# Patient Record
Sex: Female | Born: 1950 | Race: White | Hispanic: No | Marital: Single | Smoking: Never smoker
Health system: Southern US, Community
[De-identification: ages and names within clinical notes are randomized; demographics above are authoritative.]

## PROBLEM LIST (undated history)

## (undated) DIAGNOSIS — R21 Rash and other nonspecific skin eruption: Secondary | ICD-10-CM

## (undated) DIAGNOSIS — F419 Anxiety disorder, unspecified: Secondary | ICD-10-CM

## (undated) DIAGNOSIS — F900 Attention-deficit hyperactivity disorder, predominantly inattentive type: Secondary | ICD-10-CM

## (undated) DIAGNOSIS — R609 Edema, unspecified: Secondary | ICD-10-CM

## (undated) DIAGNOSIS — L409 Psoriasis, unspecified: Secondary | ICD-10-CM

## (undated) DIAGNOSIS — L309 Dermatitis, unspecified: Secondary | ICD-10-CM

## (undated) HISTORY — DX: Dermatitis, unspecified: L30.9

## (undated) HISTORY — DX: Anxiety disorder, unspecified: F41.9

## (undated) HISTORY — DX: Psoriasis, unspecified: L40.9

## (undated) HISTORY — PX: INCISION AND DRAINAGE ABSCESS / HEMATOMA OF BURSA / KNEE / THIGH: SUR668

## (undated) HISTORY — DX: Rash and other nonspecific skin eruption: R21

## (undated) HISTORY — DX: Edema, unspecified: R60.9

## (undated) HISTORY — DX: Attention-deficit hyperactivity disorder, predominantly inattentive type: F90.0

---

## 2010-11-23 ENCOUNTER — Emergency Department (HOSPITAL_COMMUNITY): Payer: Self-pay

## 2010-11-23 ENCOUNTER — Inpatient Hospital Stay (HOSPITAL_COMMUNITY)
Admission: EM | Admit: 2010-11-23 | Discharge: 2010-11-30 | DRG: 602 | Disposition: A | Discharge status: Home / Self Care | Payer: Self-pay | Source: Other Acute Inpatient Hospital | Attending: Surgery | Admitting: Surgery

## 2010-11-23 ENCOUNTER — Emergency Department (HOSPITAL_COMMUNITY)
Admission: EM | Admit: 2010-11-23 | Discharge: 2010-11-23 | Disposition: A | Discharge status: Other Acute Inpatient Hospital | Payer: Self-pay | Source: Home / Self Care | Attending: Emergency Medicine | Admitting: Emergency Medicine

## 2010-11-23 DIAGNOSIS — L03119 Cellulitis of unspecified part of limb: Secondary | ICD-10-CM | POA: Insufficient documentation

## 2010-11-23 DIAGNOSIS — L408 Other psoriasis: Secondary | ICD-10-CM | POA: Diagnosis present

## 2010-11-23 DIAGNOSIS — E876 Hypokalemia: Secondary | ICD-10-CM | POA: Insufficient documentation

## 2010-11-23 DIAGNOSIS — R197 Diarrhea, unspecified: Secondary | ICD-10-CM | POA: Diagnosis present

## 2010-11-23 DIAGNOSIS — A419 Sepsis, unspecified organism: Secondary | ICD-10-CM | POA: Insufficient documentation

## 2010-11-23 DIAGNOSIS — IMO0002 Reserved for concepts with insufficient information to code with codable children: Secondary | ICD-10-CM

## 2010-11-23 DIAGNOSIS — K7689 Other specified diseases of liver: Secondary | ICD-10-CM | POA: Insufficient documentation

## 2010-11-23 DIAGNOSIS — F1010 Alcohol abuse, uncomplicated: Secondary | ICD-10-CM | POA: Diagnosis present

## 2010-11-23 DIAGNOSIS — N179 Acute kidney failure, unspecified: Secondary | ICD-10-CM | POA: Diagnosis present

## 2010-11-23 DIAGNOSIS — N289 Disorder of kidney and ureter, unspecified: Secondary | ICD-10-CM | POA: Insufficient documentation

## 2010-11-23 DIAGNOSIS — K5730 Diverticulosis of large intestine without perforation or abscess without bleeding: Secondary | ICD-10-CM | POA: Insufficient documentation

## 2010-11-23 DIAGNOSIS — A409 Streptococcal sepsis, unspecified: Secondary | ICD-10-CM | POA: Diagnosis present

## 2010-11-23 DIAGNOSIS — R109 Unspecified abdominal pain: Secondary | ICD-10-CM | POA: Insufficient documentation

## 2010-11-23 DIAGNOSIS — L02419 Cutaneous abscess of limb, unspecified: Secondary | ICD-10-CM | POA: Insufficient documentation

## 2010-11-23 DIAGNOSIS — F411 Generalized anxiety disorder: Secondary | ICD-10-CM | POA: Diagnosis present

## 2010-11-23 LAB — URINE MICROSCOPIC-ADD ON

## 2010-11-23 LAB — BASIC METABOLIC PANEL
Chloride: 105 mEq/L (ref 96–112)
GFR calc Af Amer: 44 mL/min — ABNORMAL LOW (ref >60)
Potassium: 2.6 mEq/L — CL (ref 3.5–5.1)
Sodium: 134 mEq/L — ABNORMAL LOW (ref 135–145)

## 2010-11-23 LAB — URINALYSIS, ROUTINE W REFLEX MICROSCOPIC
Nitrite: NEGATIVE
Specific Gravity, Urine: 1.015 (ref 1.005–1.030)
Urobilinogen, UA: 0.2 mg/dL (ref 0.0–1.0)

## 2010-11-23 LAB — DIFFERENTIAL
Basophils Relative: 0 % (ref 0–1)
Eosinophils Absolute: 0 10*3/uL (ref 0.0–0.7)
Lymphs Abs: 0.5 10*3/uL — ABNORMAL LOW (ref 0.7–4.0)
Neutrophils Relative %: 94 % — ABNORMAL HIGH (ref 43–77)

## 2010-11-23 LAB — GLUCOSE, CAPILLARY: Glucose-Capillary: 133 mg/dL — ABNORMAL HIGH (ref 70–99)

## 2010-11-23 LAB — CBC
MCV: 91.9 fL (ref 78.0–100.0)
Platelets: 154 10*3/uL (ref 150–400)
RBC: 3.69 MIL/uL — ABNORMAL LOW (ref 3.87–5.11)
WBC: 18.4 10*3/uL — ABNORMAL HIGH (ref 4.0–10.5)

## 2010-11-23 LAB — LACTIC ACID, PLASMA: Lactic Acid, Venous: 2.4 mmol/L — ABNORMAL HIGH (ref 0.5–2.2)

## 2010-11-23 LAB — PROCALCITONIN: Procalcitonin: 8.25 ng/mL

## 2010-11-24 DIAGNOSIS — N171 Acute kidney failure with acute cortical necrosis: Secondary | ICD-10-CM

## 2010-11-24 DIAGNOSIS — L02419 Cutaneous abscess of limb, unspecified: Secondary | ICD-10-CM

## 2010-11-24 DIAGNOSIS — L03119 Cellulitis of unspecified part of limb: Secondary | ICD-10-CM

## 2010-11-24 DIAGNOSIS — A419 Sepsis, unspecified organism: Secondary | ICD-10-CM

## 2010-11-24 LAB — POCT I-STAT 3, ART BLOOD GAS (G3+)
O2 Saturation: 92 %
TCO2: 17 mmol/L (ref 0–100)
pCO2 arterial: 25.7 mmHg — ABNORMAL LOW (ref 35.0–45.0)
pH, Arterial: 7.416 — ABNORMAL HIGH (ref 7.350–7.400)
pO2, Arterial: 62 mmHg — ABNORMAL LOW (ref 80.0–100.0)

## 2010-11-24 LAB — APTT: aPTT: 26 seconds (ref 24–37)

## 2010-11-24 LAB — COMPREHENSIVE METABOLIC PANEL
Albumin: 2.4 g/dL — ABNORMAL LOW (ref 3.5–5.2)
Alkaline Phosphatase: 48 U/L (ref 39–117)
CO2: 21 mEq/L (ref 19–32)
Calcium: 7.2 mg/dL — ABNORMAL LOW (ref 8.4–10.5)
GFR calc Af Amer: 48 mL/min — ABNORMAL LOW (ref >60)
Sodium: 140 mEq/L (ref 135–145)
Total Bilirubin: 1.1 mg/dL (ref 0.3–1.2)
Total Protein: 5.4 g/dL — ABNORMAL LOW (ref 6.0–8.3)

## 2010-11-24 LAB — CBC
Hemoglobin: 10.8 g/dL — ABNORMAL LOW (ref 12.0–15.0)
MCH: 30.3 pg (ref 26.0–34.0)
RBC: 3.56 MIL/uL — ABNORMAL LOW (ref 3.87–5.11)
WBC: 15.1 10*3/uL — ABNORMAL HIGH (ref 4.0–10.5)

## 2010-11-24 LAB — PROTIME-INR: Prothrombin Time: 16.4 seconds — ABNORMAL HIGH (ref 11.6–15.2)

## 2010-11-24 LAB — MAGNESIUM: Magnesium: 1.7 mg/dL (ref 1.5–2.5)

## 2010-11-24 LAB — PHOSPHORUS: Phosphorus: 1.7 mg/dL — ABNORMAL LOW (ref 2.3–4.6)

## 2010-11-25 DIAGNOSIS — R6521 Severe sepsis with septic shock: Secondary | ICD-10-CM

## 2010-11-25 DIAGNOSIS — R6520 Severe sepsis without septic shock: Secondary | ICD-10-CM

## 2010-11-25 DIAGNOSIS — L03119 Cellulitis of unspecified part of limb: Secondary | ICD-10-CM

## 2010-11-25 DIAGNOSIS — A419 Sepsis, unspecified organism: Secondary | ICD-10-CM

## 2010-11-25 DIAGNOSIS — L02419 Cutaneous abscess of limb, unspecified: Secondary | ICD-10-CM

## 2010-11-25 LAB — BASIC METABOLIC PANEL
BUN: 10 mg/dL (ref 6–23)
GFR calc Af Amer: >60 mL/min (ref >60)
GFR calc non Af Amer: 52 mL/min — ABNORMAL LOW (ref >60)
Potassium: 2.7 mEq/L — CL (ref 3.5–5.1)
Sodium: 142 mEq/L (ref 135–145)

## 2010-11-25 LAB — CULTURE, BLOOD (ROUTINE X 2): Culture  Setup Time: 201203042124

## 2010-11-25 LAB — CBC
Platelets: 141 10*3/uL — ABNORMAL LOW (ref 150–400)
RDW: 13 % (ref 11.5–15.5)
WBC: 17.3 10*3/uL — ABNORMAL HIGH (ref 4.0–10.5)

## 2010-11-26 LAB — CBC
Hemoglobin: 9.2 g/dL — ABNORMAL LOW (ref 12.0–15.0)
MCV: 88.9 fL (ref 78.0–100.0)
Platelets: 159 10*3/uL (ref 150–400)
RBC: 2.98 MIL/uL — ABNORMAL LOW (ref 3.87–5.11)
WBC: 13.9 10*3/uL — ABNORMAL HIGH (ref 4.0–10.5)

## 2010-11-26 LAB — BASIC METABOLIC PANEL
BUN: 12 mg/dL (ref 6–23)
Chloride: 116 mEq/L — ABNORMAL HIGH (ref 96–112)
GFR calc Af Amer: >60 mL/min (ref >60)
Potassium: 2.9 mEq/L — ABNORMAL LOW (ref 3.5–5.1)

## 2010-11-26 LAB — MAGNESIUM: Magnesium: 1.8 mg/dL (ref 1.5–2.5)

## 2010-11-26 LAB — WOUND CULTURE

## 2010-11-26 LAB — CLOSTRIDIUM DIFFICILE BY PCR: Toxigenic C. Difficile by PCR: NEGATIVE

## 2010-11-27 LAB — BASIC METABOLIC PANEL
GFR calc non Af Amer: >60 mL/min (ref >60)
Potassium: 2.7 mEq/L — CL (ref 3.5–5.1)
Sodium: 141 mEq/L (ref 135–145)

## 2010-11-27 NOTE — Op Note (Signed)
  NAMEMIU, CHIONG             ACCOUNT NO.:  [id number]  MEDICAL RECORD NO.:  [id number]           PATIENT TYPE:  I  LOCATION:  2106                         FACILITY:  [facility]  PHYSICIAN:  Currie Paris, M.D.DATE OF BIRTH:  February 26, 1951  DATE OF PROCEDURE:  11/24/2010 DATE OF DISCHARGE:                              OPERATIVE REPORT   PREOPERATIVE DIAGNOSIS:  Abscess right thigh with surrounding cellulitis, likely strep.  POSTOPERATIVE DIAGNOSIS:  Abscess right thigh with surrounding cellulitis, likely strep.  PROCEDURE:  Incision and drainage of right thigh abscess.  SURGEON:  Currie Paris, MD  ANESTHESIA:  Local.  CLINICAL HISTORY:  This is a 59-year lady who was diagnosed with a septic syndrome, some renal insufficiency, hypokalemia, and a cellulitis of her right thigh.  She has been started on antibiotics.  Overnight, the area has somewhat extended with erythema and she has developed some "blistering" in the middle area of the erythema as what appears to be soft developing fluctuant area.  After discussion with the patient, she elected to have [place] drained this under local anesthesia.  Immediately after the discussion and identification of the site, the area was prepped with 1% Xylocaine with epi.  I then waited about 10 minutes, reprepped it with some Betadine.  I made an incision directly over the fluctuant area and pus drained out.  This was cultured.  There were some superficial pockets, somewhat slightly deeper, but I think this was fairly well drained with this I and D.  The patient had received some fentanyl just prior to doing the I and D, and was comfortable throughout.  Packing was placed and we will plan to see her again tomorrow.  I told her that if this does not begin to improve with this drainage, she may need to have this more widely drained in the operating room.     Currie Paris, M.D.     CJS/MEDQ  D:  11/24/2010  T:   11/25/2010  Job:  [id number]  Electronically Signed by Cyndia Bent M.D. on 11/26/2010 04:11:02 PM

## 2010-11-28 LAB — BASIC METABOLIC PANEL
CO2: 29 mEq/L (ref 19–32)
Chloride: 110 mEq/L (ref 96–112)
GFR calc non Af Amer: >60 mL/min (ref >60)
Glucose, Bld: 110 mg/dL — ABNORMAL HIGH (ref 70–99)
Potassium: 2.3 mEq/L — CL (ref 3.5–5.1)
Sodium: 143 mEq/L (ref 135–145)

## 2010-11-28 LAB — CBC
HCT: 28.8 % — ABNORMAL LOW (ref 36.0–46.0)
Hemoglobin: 10.1 g/dL — ABNORMAL LOW (ref 12.0–15.0)
WBC: 9.8 10*3/uL (ref 4.0–10.5)

## 2010-11-29 LAB — POTASSIUM: Potassium: 2.7 mEq/L — CL (ref 3.5–5.1)

## 2010-11-29 LAB — ANAEROBIC CULTURE

## 2010-11-29 LAB — MAGNESIUM: Magnesium: 1.9 mg/dL (ref 1.5–2.5)

## 2010-11-30 LAB — BASIC METABOLIC PANEL
BUN: 3 mg/dL — ABNORMAL LOW (ref 6–23)
GFR calc non Af Amer: >60 mL/min (ref >60)
Potassium: 3.5 mEq/L (ref 3.5–5.1)
Sodium: 142 mEq/L (ref 135–145)

## 2010-11-30 LAB — MAGNESIUM: Magnesium: 1.8 mg/dL (ref 1.5–2.5)

## 2010-11-30 NOTE — Consult Note (Signed)
[name], [name]  MEDICAL RECORD NO.:  Kennedy           PATIENT TYPE:  LOCATION:                                 FACILITY:  PHYSICIAN:  Lonia Blood, M.D.       DATE OF BIRTH:  [birth year]  DATE OF CONSULTATION:  11/29/2010 DATE OF DISCHARGE:                                CONSULTATION   [place] physician [place] in [place].  REQUESTING PHYSICIAN FOR CONSULTATION:  Emelia Loron, MD  REASON FOR CONSULTATION:  Persistent hypokalemia.  HISTORY OF PRESENT ILLNESS:  [name] is a 60 year old woman without major past medical history who was taking prednisone for psoriasis.  She developed a severe right thigh abscess with cellulitis on November 24, 2010 and was admitted to intensive care unit with sepsis and severe cellulitis.  She was started on broad-spectrum antibiotics and also on stress dose intravenous hydrocortisone.  For the course of hospitalization, [name] remains significantly hypokalemic.  She required initially fluid resuscitation and then following the fluid resuscitation, she had significant polyuria.  She then went on to develop severe diarrhea from the antibiotics and in fact today, November 29, 2010 is the first day where she has not been having constant diarrhea.  [name] also received stress dose of steroids.  The patient reports that she has a primary care physician at [facility] which has been checking a basic metabolic profile on her on a yearly basis and she was never told that she has hypokalemia before.  She does not have any congenital abnormalities that she knows of.  Her only past medical history is one of psoriasis, anxiety, and remote history of alcohol abuse.  She says she quit 3 years ago.  CURRENT MEDICATIONS: 1. Augmentin 875 mg twice a day. 2. Heparin 5000 units every 8 hours. 3. Protonix 40 mg daily. 4. Potassium runs. 5. Albuterol as needed. 6. Tylenol and  Zofran. 7. Percocet and Ambien.  SOCIAL HISTORY:  The patient lives alone in [place], [place]. She drinks a glass of wine once a month.  Denies smoking cigarettes or drug use.  FAMILY HISTORY:  Father is healthy.  REVIEW OF SYSTEMS:  Positive for the right thigh abscess and pain. Otherwise, the patient is in good health.  PHYSICAL EXAMINATION:  VITAL SIGNS:  Temperature 98.7, heart rate 68, respirations 18, blood pressure 116/65, saturating 94% on room air. GENERAL:  The patient is alert and oriented in no acute distress. HEAD:  Normocephalic, atraumatic. EYES:  Pupils are equal, round, react to light and accommodation. Extraocular movements are intact. THROAT:  Clear. NECK:  Supple.  No JVD. CHEST:  Clear to auscultation without wheezes, rhonchi, or crackles. HEART:  Regular rate and rhythm without murmurs, rubs, or gallops. ABDOMEN:  Soft, nontender.  Bowel sounds are present. EXTREMITIES:  Right lower extremity, there is an abscess in the inner right thigh that is packed with mild surrounding cellulitis.  No suspicious looking rashes.  LABORATORY VALUES AT THE TIME OF CONSULTATION:  Magnesium 1.1, potassium is 2.7.  Sodium is 143, chloride 110, bicarbonate 29, BUN 3,  creatinine 0.7, calcium 7.8.  Culture of the wound shows group A strep.  IMPRESSION/RECOMMENDATION:  This is a patient with severe persistent hypokalemia which I feel is related to diarrhea, polyuria, and steroid use.  I noted that the patient was admitted hypokalemic on November 20, 2010 with a potassium of 2.6.  The etiology of this hypokalemia is little bit obscured to me.  It is possible that this was related to prednisone usage in the outpatient setting.  I doubt that the patient has renal tubular abnormalities.  She does not report history of chronic hypokalemia.  In any case currently she has 3 good reasons to be hypokalemic from polyuria, diarrhea, and steroids.  I am going to replace the  patient's potassium adequately to 40 mEq every 4 hours x3 doses which should render normal potassium level by tomorrow, November 30, 2010.  If that is not the case, we will step up the investigation by checking a urine potassium level and urine creatinine level, calculating for the fraction __________ potassium.  I will discontinue the psyllium and the Protonix which prompt patient's to diarrhea.  Otherwise the rest of the care of the patient, antibiotics, and local care per the surgical service.     Lonia Blood, M.D.     SL/MEDQ  D:  11/29/2010  T:  11/29/2010  Job:  [id number]  cc:   Juanetta Gosling, MD  Electronically Signed by Lonia Blood M.D. on 11/30/2010 09:57:58 AM

## 2010-12-04 NOTE — H&P (Signed)
[name], [name] NO.:  [id number]  MEDICAL RECORD NO.:  [id number]           PATIENT TYPE:  I  LOCATION:  2106                         FACILITY:  [facility]  PHYSICIAN:  Orbie Hurst, MD         DATE OF BIRTH:  1951-05-27  DATE OF ADMISSION:  11/23/2010 DATE OF DISCHARGE:                             HISTORY & PHYSICAL   The patient was transferred from [facility].  ER ATTENDING:  Devoria Albe, MD  REASON FOR ADMISSION:  Right leg cellulitis.  HISTORY OF PRESENT ILLNESS:  The patient is a 60 year old female with past medical history of psoriasis who was transferred from [facility] due to the right leg cellulitis.  As per the patient and as per the medical records, the patient had right tight erythema that has started about 2 days ago and has been rapidly progressing.  She reported fevers of 103 yesterday and pain, warmth, and erythema in the area.  She was seen by her family members today on November 23, 2010, and they brought her to the ER at [facility].  On further review, the patient reports that she has been taking prednisone between 10-50 mg a day for the last 2 weeks for psoriasis exacerbation on her feet.  She denies any trauma or insect bite at the site of the cellulitis.  Initial CBC showed WBC 18.4 with neutrophils 94%. Creatinine was 1.48 and she has received about 5 liters of IV fluids with systolic blood pressure between 100 and 95 mmHg.  She was started on Zosyn, vancomycin, and clindamycin.  A CT scan of the right leg showed medial right thigh subcutaneous fat stranding with reactive inguinal adenopathy, findings are compatible with cellulitis.  The patient was transferred to [facility], and admitted to the MICU for further evaluation and management.  REVIEW OF SYSTEMS:  All other systems were negative except as above in HPI.  PAST MEDICAL HISTORY: 1. History of psoriasis on treatment with prednisone for the  last 2     weeks from 10-50 mg a with a tapered dose. 2. Possible history of alcohol abuse.  The patient drinks wine daily     from two glasses to one bottle a day.  PAST SURGICAL HISTORY:  No known surgeries.  ALLERGIES:  CODEINE with itching.  SOCIAL HISTORY:  The patient works as a Tax adviser.  She lives in [place] by herself.  She has one dog.  She reports that she drinks between two glasses of wine to one bottle every day.  She denies smoking or recent drug use.  She denies any significant occupational exposure or sick contacts.  FAMILY HISTORY:  Mother and father are healthy.  No history of lung disease, heart disease, or cancer in her family.  PHYSICAL EXAMINATION:  GENERAL:  The patient is alert and oriented x3, cooperative in no acute respiratory distress. VITAL SIGNS:  Blood pressure 95/72, respiratory rate 18, O2 saturation 97% on room air, respiratory rate 85, temperature 37.7.  HEENT:  Oral mucosa is moist.  No JVD.  No cervical or supraclavicular lymphadenopathy.  No oral  thrush and no postnasal drip.  CARDIOVASCULAR: Regular rhythm, S1 and S2 normal. LUNGS:  Clear breath sounds bilaterally with no wheezes, rhonchi, or crackles. ABDOMEN:  Soft, mildly tender in the right lower quadrant, left lower quadrant, and suprapubic area.  She also has tenderness on the right inguinal area.  Bowel sounds active and no peritoneal signs. EXTREMITIES:  The patient has erythema, warmth, and induration in the internal area of the right leg.  It has been marked and it has progressed from the internal area of the right leg to the posterior area.  Positive inguinal lymphadenopathy.  No calf tenderness. NEUROLOGICAL:  Alert and oriented x3.  No focal deficit.  LABORATORY AND IMAGING DATA: 1. CBC, WBC 18.4, hemoglobin 11.2, hematocrit 33.9, platelet 154,     neutrophils 94%. 2. BMP, sodium 134, potassium 2.6, chloride 105, CO2 of 19, glucose     142, BUN 28, creatinine 1.48, lactic  acid 2.4, procalcitonin 8.25,     MRSA screen negative. 3. Chest x-ray portable with no acute infiltrates or pleural     effusions. 4. CT scan of the abdomen and pelvis with mild gaseous distention and     air fluid level within the cecum.  Negative for evidence of bowel     obstruction or bowel wall thickening.  Evidence of diverticulosis     of the colon.  A 2-cm cystic structure in the right ovary. 5. CT scan of the right femur with the medial right side subcutaneous     fat stranding with reactive inguinal adenopathy.  ASSESSMENT AND PLAN:  The patient is a 60 year old female with past medical history of psoriasis and possible alcohol abuse who was admitted initially to [facility] due to right leg cellulitis which has been progressive for the last 2 days with increasing induration and erythema and warmth and fevers and who has been on prednisone treatment for the last 2 weeks due to psoriasis. 1. Cellulitis.  The patient has right leg cellulitis which has been     progressing for the last 2 days.  She has been taking     steroid treatment with prednisone for psoriasis for the last 2     weeks.  She denies any trauma or insect bite at the site.  She has     leukocytosis and a CT scan of the right leg showed findings     compatible with cellulitis.  We have to rule out necrotizing     fasciitis, due to the extension and rapid progression of these     lesions.  Surgery Service is consulted.  We will continue     antibiotics treatment with vancomycin, clindamycin, and Zosyn.  We     will get blood cultures and urine cultures.  MRSA screening test     was negative.  We continued hydrocortisone 100 mg twice a day due     to history of high-dose prednisone treatment to avoid adrenal insufficiency.     The patient will be admitted and followed at the intensive care     unit and will have low threshold to start sepsis protocol if her     blood pressure drops.  We will follow  complete metabolic panel     including liver function tests, CBC, cultures, and ABG. 2. History of psoriasis.  The patient received prednisone treatment     for the last 2 weeks which increases her risk of infections.  We     will continue hydrocortisone  at this point in time to avoid adrenal     insufficiency. 3. History of alcohol abuse.  As per the family members, the patient     has been drinking between two glasses to one bottle every day for     the last past year.  We will get CIWA protocol in place in case the     patient is agitated. 4. Deep vein thrombosis prophylaxis.  Heparin subcu. 5. Fluids, electrolytes, and nutrition.  We will continue normal     saline at 150 per hour.  Electrolytes to replace as     per ICU protocol.  We will repeat the complete metabolic panel to follow creatinine and     magnesium and phosphate levels.  N.p.o. for now.  TOTAL CRITICAL CARE TIME:  Sixty minutes.     Orbie Hurst, MD     JR/MEDQ  D:  11/24/2010  T:  11/24/2010  Job:  [id number]  Electronically Signed by Orbie Hurst M.D. on 12/04/2010 11:48:17 AM

## 2010-12-06 NOTE — Consult Note (Signed)
  NAMELARA, [name] NO.:  [id number]  MEDICAL RECORD NO.:  [id number]           PATIENT TYPE:  LOCATION:                                 FACILITY:  PHYSICIAN:  Cherylynn Ridges, M.D.    DATE OF BIRTH:  1951/05/08  DATE OF CONSULTATION: DATE OF DISCHARGE:                                CONSULTATION   REQUESTING PHYSICIAN:  Orbie Hurst, MD  Dear Dr. Synetta Fail:  Thank you very much for asking me to see [name], a pleasant 60- year-old female, with severe cellulitis of her right leg.  This started on Friday and worsened through the weekend.  She came to [facility] ER where she was found to have this significant cellulitis and/or infection; however, because of concerns of necrotizing fasciitis she was transferred to [facility] for further evaluation and treatment in the Medicine Service.  Her past medical history is unremarkable.  She has had no prior surgery.  I focused on examination where she had about a 15- x 13-cm right medial upper thigh infection, feels more like induration with some skin sloughing to a mild degree.  No fluctuance which correlates with the patient's CAT scan.  My impression is that she has a very severe deep cellulitis, does not appear to be a Fournier's as the patient does not appear to be that sick yet.  Her white count is 18,000. She is on IV antibiotics and the progression of the cellulitis seems to have improved, at least not worsened since she has been on her IV antibiotics.  We will go ahead and try to treat this medically; however, if it should worsen or if the patient gets sicker with septic complications then we will have to see about doing an open debridement of this area.     Cherylynn Ridges, M.D.     JOW/MEDQ  D:  11/24/2010  T:  11/24/2010  Job:  [id number]  Electronically Signed by Jimmye Norman M.D. on 12/06/2010 02:47:59 PM

## 2010-12-19 NOTE — Discharge Summary (Signed)
NAMEVAUNDA, [name]              ACCOUNT NO.:  [id number]  MEDICAL RECORD NO.:  [id number]           PATIENT TYPE:  I  LOCATION:  5532                         FACILITY:  [facility]  PHYSICIAN:  Juanetta Gosling, MDDATE OF BIRTH:  1951/03/07  DATE OF ADMISSION:  11/23/2010 DATE OF DISCHARGE:  11/30/2010                              DISCHARGE SUMMARY   DISCHARGING PHYSICIAN:  Juanetta Gosling, MD  PROCEDURES:  Incision and drainage of right thigh abscess at the bedside by Currie Paris, MD on November 24, 2010.  CONSULTANTS: 1. Cherylynn Ridges, MD with [facility]. 2. Lonia Blood, MD with Hospitalist.  REASON FOR ADMISSION:  [name] is a 60 year old female who was transferred from [facility] due to right leg cellulitis.  The patient began getting right thigh erythema which started approximately 2 days prior to admission.  It rapidly progressed.  She reports subjective fevers at home up to 103.  She stated she had increased pain, warmth and redness at this site on the inner portion of the right thigh.  She denied any trauma or insect bites to this area.  Upon arrival to the emergency department, she was found to have a white blood cell count of 18,400.  She was started on Zosyn, vancomycin, and clindamycin.  A CT scan of the right leg revealed right thigh subcutaneous fat stranding with reactive inguinal adenopathy which was consistent with cellulitis but no abscess.  Please see admitting history and physical for further details.  ADMITTING DIAGNOSES: 1. Right thigh cellulitis. 2. History of psoriasis. 3. History of alcohol abuse.  HOSPITAL COURSE:  At this time, the patient was admitted to NICU as upon evaluation, the patient was found to be somewhat hypotensive with a blood pressure of 95/72 and thought to be septic.  General Surgery was asked to evaluate the patient.  Upon initial evaluation, it was not felt that this area could develop into an  abscess and needed an incision and drainage.  However, by hospital day #2, the patient had developed worsening erythema along with blistering and vesicles noted on central portion of the cellulitic area.  Therefore, at this time, it was felt that the patient would need a bedside incision and drainage at the least to help fix this area.  Therefore, the area was anesthetized and incision and drainage at the bedside was completed.  Some purulent drainage was received and sent for culture.  This did grow back beta strep group A.  Beta strep group A was also found to be positive in one of her blood cultures.  B.i.d. wet-to-dry dressing changes were started. After the patient began to improve clinically, she was transferred to a regular floor.  Over the next several days, the redness began to decrease as well as pain.  Her dressing changes were continued.  She was shown how to do her dressing changes and she completed these herself and did not require any home health assistance for this.  The patient eventually was switched from all of her IV antibiotics to p.o. Augmentin.  During her hospitalization, the patient was found to have significant hypokalemia.  Each day, this was aggressively replaced.  However, it remained in the high 2 most of the time.  Her magnesium was checked which was normal.  However, due to low normal, she was replaced with 2 g of magnesium.  It did appear the patient began to auto-diurese given her recent sepsis.  Eventually after the patient was transferred to the SURGICAL SERVICE due to continued issues with hypokalemia, the hospitalists were consulted to rule out an adrenal insufficiency, cause for her hypokalemia.  They felt that her hypokalemia was secondary to polyuria, diarrhea, and recent steroid use.  As the patient's auto- diuresing began to decrease on the date of discharge, the patient's potassium has finally increased to 3.5.  At this time, the patient  was felt stable for discharge home.  DISCHARGE DIAGNOSES: 1. Right thigh abscess status post incision and drainage. 2. Group A Streptococcus bacteremia. 3. Hypokalemia. 4. Sepsis. 5. History of psoriasis. 6. History of alcohol abuse.  Please see medication reconciliation form for discharge medications.  DISCHARGE INSTRUCTIONS:  The patient has no activity restrictions.  They may walk up steps and may shower.  She has no dietary restrictions.  She is to do dressing changes twice daily with normal saline.  She is to call our office for an appointment in 2-3 weeks for followup.     Letha Cape, PA   ______________________________ Juanetta Gosling, MD    KEO/MEDQ  D:  12/17/2010  T:  12/18/2010  Job:  [id number]  Electronically Signed by Barnetta Chapel PA on 12/18/2010 10:17:35 AM Electronically Signed by Emelia Loron MD on 12/19/2010 08:24:11 AM

## 2011-06-10 ENCOUNTER — Encounter (INDEPENDENT_AMBULATORY_CARE_PROVIDER_SITE_OTHER): Payer: Self-pay | Admitting: Surgery

## 2011-06-11 ENCOUNTER — Ambulatory Visit (INDEPENDENT_AMBULATORY_CARE_PROVIDER_SITE_OTHER): Payer: BC Managed Care – PPO | Admitting: General Surgery

## 2011-06-11 ENCOUNTER — Encounter (INDEPENDENT_AMBULATORY_CARE_PROVIDER_SITE_OTHER): Payer: Self-pay | Admitting: General Surgery

## 2011-06-11 VITALS — BP 118/76 | HR 80 | Temp 96.8°F | Resp 16 | Ht 69.0 in | Wt 161.4 lb

## 2011-06-11 DIAGNOSIS — L02419 Cutaneous abscess of limb, unspecified: Secondary | ICD-10-CM

## 2011-06-11 DIAGNOSIS — IMO0002 Reserved for concepts with insufficient information to code with codable children: Secondary | ICD-10-CM

## 2011-06-11 DIAGNOSIS — L02411 Cutaneous abscess of right axilla: Secondary | ICD-10-CM

## 2011-06-11 NOTE — Patient Instructions (Signed)
Cover with gauze.  Remove iodoform gauze Saturday.  Continue antibiotics. Return next week

## 2011-06-11 NOTE — Progress Notes (Signed)
Subjective:     Patient ID: [name], female   DOB: 25-Jun-1951, 60 y.o.   MRN: [id number]  HPI Patient presents complaining of right axillary abscess. It drained a small amount of fluid. She is to keep unit last night. It has been painful. She was seen at urgent care and started on Septra DS. She is known to our acute care surgery service from previous abscess of the right thigh.  Review of Systems     Objective:   Physical Exam Right axilla has a 2 cm raised fluctuant area with central erythema. There is mild bloody drainage. Procedure note: The area was prepped in sterile fashion local anesthetic was injected in the area was incised and drained. There is a moderate amount of purulent material. Some thicker pus was debrided out of the wound. This was sent for culture. Hemostasis was obtained with pressure. The wound was packed with quarter-inch iodoform gauze. A gauze dressing was applied. Patient tolerated this wellThe patient also showed me her right thigh scars. There is some residual hyperpigmentation but no ongoing infection..   Assessment:     Right axillary abscess    Plan:     This was incised and drained as described above. Patient will continue antibiotics. Wound care instructions were given. We'll see her back next week. She will call if things worsen in the interim.

## 2011-06-14 LAB — WOUND CULTURE

## 2013-06-14 ENCOUNTER — Ambulatory Visit (INDEPENDENT_AMBULATORY_CARE_PROVIDER_SITE_OTHER): Payer: BC Managed Care – PPO | Admitting: Family Medicine

## 2013-06-14 ENCOUNTER — Encounter: Payer: Self-pay | Admitting: Family Medicine

## 2013-06-14 VITALS — BP 119/74 | HR 90 | Resp 16 | Ht 68.0 in | Wt 159.0 lb

## 2013-06-14 DIAGNOSIS — R609 Edema, unspecified: Secondary | ICD-10-CM | POA: Insufficient documentation

## 2013-06-14 DIAGNOSIS — F411 Generalized anxiety disorder: Secondary | ICD-10-CM

## 2013-06-14 DIAGNOSIS — L408 Other psoriasis: Secondary | ICD-10-CM

## 2013-06-14 DIAGNOSIS — F988 Other specified behavioral and emotional disorders with onset usually occurring in childhood and adolescence: Secondary | ICD-10-CM

## 2013-06-14 DIAGNOSIS — M7150 Other bursitis, not elsewhere classified, unspecified site: Secondary | ICD-10-CM

## 2013-06-14 DIAGNOSIS — M719 Bursopathy, unspecified: Secondary | ICD-10-CM | POA: Insufficient documentation

## 2013-06-14 DIAGNOSIS — L409 Psoriasis, unspecified: Secondary | ICD-10-CM | POA: Insufficient documentation

## 2013-06-14 MED ORDER — CITALOPRAM HYDROBROMIDE 20 MG PO TABS: 20.0000 mg | ORAL_TABLET | Freq: Every day | ORAL | Status: DC

## 2013-06-14 MED ORDER — LISDEXAMFETAMINE DIMESYLATE 50 MG PO CAPS: 50.0000 mg | ORAL_CAPSULE | ORAL | Status: DC

## 2013-06-14 MED ORDER — CLOBETASOL PROPIONATE EMULSION 0.05 % EX FOAM: 1.0000 "application " | Freq: Two times a day (BID) | CUTANEOUS | Status: DC

## 2013-06-14 MED ORDER — FUROSEMIDE 20 MG PO TABS: 20.0000 mg | ORAL_TABLET | Freq: Two times a day (BID) | ORAL | Status: DC

## 2013-06-14 MED ORDER — DICLOFENAC SODIUM 1 % TD GEL: 4.0000 g | Freq: Four times a day (QID) | TRANSDERMAL | Status: AC

## 2013-06-14 MED ORDER — HYDROXYZINE PAMOATE 25 MG PO CAPS: ORAL_CAPSULE | ORAL | Status: DC

## 2013-06-14 MED ORDER — CLOBETASOL PROPIONATE 0.05 % EX OINT: TOPICAL_OINTMENT | Freq: Two times a day (BID) | CUTANEOUS | Status: DC

## 2013-06-14 NOTE — Progress Notes (Signed)
Subjective:    Patient ID: [name], female    DOB: 01-24-1951, 62 y.o.   MRN: [id number]  HPI  [name] is here today to discuss the issues listed below and to get medication refills.    1)  Anxiety: Overall, she is doing well on the Celexa.  She does have moments when she feels that she needs additional help.  Her life at home is very stressful.    2)  Edema: She takes Lasix occasionally for edema.    3)  ADD: She has been struggling with her concentration at work.  She has taken Vyvanse before and would like to get back on it.   4)  Psoriasis:  She has been having more trouble with her skin lately.  She feels that it is related to her stress.    5)  Left Hip Pain:  She has been having pain in her left hip.      Review of Systems  Constitutional: Negative.           HENT: Negative.   Eyes: Negative.   Respiratory: Negative.   Cardiovascular: Negative.   Gastrointestinal: Negative.   Endocrine: Negative.   Genitourinary: Negative.   Musculoskeletal:       Left Hip Pain   Skin: Positive for rash.       She has psoriasis on both her hands and feet  Allergic/Immunologic: Negative.   Neurological: Negative.   Hematological: Negative.   Psychiatric/Behavioral: Positive for decreased concentration. The patient is nervous/anxious.      Past Medical History  Diagnosis Date  . Rash   . Anxiety   . Edema   . Eczema   . ADHD (attention deficit hyperactivity disorder), inattentive type   . Psoriasis     Hands/ Feet     Family History  Problem Relation Age of Onset  . Hypertension Mother   . Depression Mother   . Depression Father   . Cancer Maternal Aunt     breast cancer      History   Social History Narrative   Marital Status: Single   Children:  None   Pets:  Dog (1)     Living Situation: Lives with parents.    Occupation: Lawyer)     Education: Theatre stage manager)     Tobacco Use/Exposure:  None    Alcohol Use:  3-4 times a week (Wine)    Drug Use:  None   Diet:  Regular   Exercise:  3-4 times a week (Walking/ 30 min exercise class)    Hobbies:  Travel/Cooking                  Objective:   Physical Exam  Nursing note and vitals reviewed. Constitutional: She is oriented to person, place, and time. She appears well-developed and well-nourished.  HENT:  Head: Normocephalic.  Eyes: Conjunctivae are normal. Pupils are equal, round, and reactive to light. No scleral icterus.  Neck: Normal range of motion. Neck supple. No thyromegaly present.  Cardiovascular: Normal rate, regular rhythm and normal heart sounds.   Pulmonary/Chest: Effort normal and breath sounds normal.  Abdominal: Soft.  Musculoskeletal: Normal range of motion. She exhibits no edema and no tenderness.       Legs: Lymphadenopathy:    She has no cervical adenopathy.  Neurological: She is alert and oriented to person, place, and time.  Skin: Skin is warm and dry. Rash noted.  She has several patches of  psoriasis on her hands, shins and feet  Psychiatric: She has a normal mood and affect. Her behavior is normal. Judgment and thought content normal.          Assessment & Plan:

## 2013-06-14 NOTE — Assessment & Plan Note (Signed)
Refilled her citalopram and gave her Vistaril to take prn increased anxiety.

## 2013-06-14 NOTE — Assessment & Plan Note (Signed)
She was given a 3 month supply for Vyvanse.

## 2013-06-14 NOTE — Assessment & Plan Note (Signed)
We discussed doing an injection in her left bursa.  She would prefer to just use the Voltaren Gel for now.

## 2013-06-14 NOTE — Patient Instructions (Addendum)
1)  Mood - Continue on your citalopram daily and take the Vistaril occasionally as needed.    2)  Skin - Steroid ointment vs foam, Tar Shampoo, Clorox Bath (1/4 cup in tub of water).  Since your skin always improves when you do HCG, you might consider eating the allowed foods at a higher amount.  If your skin does not improve I would recommend following up with your dermatologist.  I can recommend one if you need me to.   3)  ADD - Get back on the Vyvanse.    4)  Left Hip Pain - We can do an injection into the bursa if needed .   Hip Bursitis Bursitis is a puffiness (swelling) and soreness of a fluid-filled sac (bursa). This sac covers and protects the joint. HOME CARE  Put ice on the injured area.  Put ice in a plastic bag.  Place a towel between your skin and the bag.  Leave the ice on for 15-20 minutes, 3-4 times a day.  Rest the painful joint as much as possible. Move your joint at least 4 times a day. When pain lessens, start normal, slow movements and normal activities.  Only take medicine as told by your doctor.  Use crutches as told.  Raise (elevate) your painful joint. Use pillows for propping your legs and hips.  Get a massage to lessen pain. GET HELP RIGHT AWAY IF:  Your pain increases or does not improve during treatment.  You have a fever.  You feel heat coming from the affected area.  You see redness and puffiness around the affected area.  You have any questions or concerns. MAKE SURE YOU:  Understand these instructions.  Will watch your condition.  Will get help right away if you are not well or get worse. Document Released: 10/10/2010 Document Revised: 11/30/2011 Document Reviewed: 10/10/2010 [facility] Patient Information 2014 [place], [place].  Trochanteric Bursitis You have hip pain due to trochanteric bursitis. Bursitis means that the sack near the outside of the hip is filled with fluid and inflamed. This sack is made up of protective soft tissue.  The pain from trochanteric bursitis can be severe and keep you from sleep. It can radiate to the buttocks or down the outside of the thigh to the knee. The pain is almost always worse when rising from the seated or lying position and with walking. Pain can improve after you take a few steps. It happens more often in people with hip joint and lumbar spine problems, such as arthritis or previous surgery. Very rarely the trochanteric bursa can become infected, and antibiotics and/or surgery may be needed. Treatment often includes an injection of local anesthetic mixed with cortisone medicine. This medicine is injected into the area where it is most tender over the hip. Repeat injections may be necessary if the response to treatment is slow. You can apply ice packs over the tender area for 30 minutes every 2 hours for the next few days. Anti-inflammatory and/or narcotic pain medicine may also be helpful. Limit your activity for the next few days if the pain continues. See your caregiver in 5-10 days if you are not greatly improved.  SEEK IMMEDIATE MEDICAL CARE IF:  You develop severe pain, fever, or increased redness.  You have pain that radiates below the knee. EXERCISES STRETCHING EXERCISES - Trochantic Bursitis  These exercises may help you when beginning to rehabilitate your injury. Your symptoms may resolve with or without further involvement from your physician, physical therapist  or Event organiser. While completing these exercises, remember:   Restoring tissue flexibility helps normal motion to return to the joints. This allows healthier, less painful movement and activity.  An effective stretch should be held for at least 30 seconds.  A stretch should never be painful. You should only feel a gentle lengthening or release in the stretched tissue. STRETCH  Iliotibial Band  On the floor or bed, lie on your side so your injured leg is on top. Bend your knee and grab your ankle.  Slowly bring  your knee back so that your thigh is in line with your trunk. Keep your heel at your buttocks and gently arch your back so your head, shoulders and hips line up.  Slowly lower your leg so that your knee approaches the floor/bed until you feel a gentle stretch on the outside of your thigh. If you do not feel a stretch and your knee will not fall farther, place the heel of your opposite foot on top of your knee and pull your thigh down farther.  Hold this stretch for __________ seconds.  Repeat __________ times. Complete this exercise __________ times per day. STRETCH Hamstrings, Supine   Lie on your back. Loop a belt or towel over the ball of your foot as shown.  Straighten your knee and slowly pull on the belt to raise your injured leg. Do not allow the knee to bend. Keep your opposite leg flat on the floor.  Raise the leg until you feel a gentle stretch behind your knee or thigh. Hold this position for __________ seconds.  Repeat __________ times. Complete this stretch __________ times per day. STRETCH - Quadriceps, Prone   Lie on your stomach on a firm surface, such as a bed or padded floor.  Bend your knee and grasp your ankle. If you are unable to reach, your ankle or pant leg, use a belt around your foot to lengthen your reach.  Gently pull your heel toward your buttocks. Your knee should not slide out to the side. You should feel a stretch in the front of your thigh and/or knee.  Hold this position for __________ seconds.  Repeat __________ times. Complete this stretch __________ times per day. STRETCHING - Hip Flexors, Lunge Half kneel with your knee on the floor and your opposite knee bent and directly over your ankle.  Keep good posture with your head over your shoulders. Tighten your buttocks to point your tailbone downward; this will prevent your back from arching too much.  You should feel a gentle stretch in the front of your thigh and/or hip. If you do not feel any  resistance, slightly slide your opposite foot forward and then slowly lunge forward so your knee once again lines up over your ankle. Be sure your tailbone remains pointed downward.  Hold this stretch for __________ seconds.  Repeat __________ times. Complete this stretch __________ times per day. STRETCH - Adductors, Lunge  While standing, spread your legs  Lean away from your injured leg by bending your opposite knee. You may rest your hands on your thigh for balance.  You should feel a stretch in your inner thigh. Hold for __________ seconds.  Repeat __________ times. Complete this exercise __________ times per day. Document Released: 10/15/2004 Document Revised: 11/30/2011 Document Reviewed: 12/20/2008 [facility] Patient Information 2014 [place], [place].

## 2013-06-14 NOTE — Assessment & Plan Note (Signed)
She will compare the cost vs effectiveness of Olux E vs clobetasol ointment.

## 2013-06-14 NOTE — Assessment & Plan Note (Signed)
Refilled her Lasix.

## 2013-08-04 ENCOUNTER — Encounter (INDEPENDENT_AMBULATORY_CARE_PROVIDER_SITE_OTHER): Payer: Self-pay

## 2013-08-04 ENCOUNTER — Ambulatory Visit (INDEPENDENT_AMBULATORY_CARE_PROVIDER_SITE_OTHER): Payer: BC Managed Care – PPO | Admitting: Family Medicine

## 2013-08-04 ENCOUNTER — Encounter: Payer: Self-pay | Admitting: Family Medicine

## 2013-08-04 VITALS — BP 133/72 | HR 95 | Resp 16 | Ht 67.3 in | Wt 162.0 lb

## 2013-08-04 DIAGNOSIS — M25559 Pain in unspecified hip: Secondary | ICD-10-CM

## 2013-08-04 DIAGNOSIS — M76899 Other specified enthesopathies of unspecified lower limb, excluding foot: Secondary | ICD-10-CM

## 2013-08-04 DIAGNOSIS — M25552 Pain in left hip: Secondary | ICD-10-CM

## 2013-08-04 DIAGNOSIS — M7072 Other bursitis of hip, left hip: Secondary | ICD-10-CM

## 2013-08-04 MED ORDER — TRAMADOL HCL 50 MG PO TABS: 50.0000 mg | ORAL_TABLET | Freq: Two times a day (BID) | ORAL | Status: DC | PRN

## 2013-08-04 MED ORDER — CYCLOBENZAPRINE HCL 5 MG PO TABS: ORAL_TABLET | ORAL | Status: DC

## 2013-08-04 MED ORDER — TRIAMCINOLONE ACETONIDE 40 MG/ML IJ SUSP
40.0000 mg | Freq: Once | INTRAMUSCULAR | Status: AC
  Administered 2013-08-04: 40 mg via INTRA_ARTICULAR

## 2013-08-04 MED ORDER — TRIAMCINOLONE ACETONIDE 40 MG/ML IJ SUSP: 40.0000 mg | Freq: Once | INTRAMUSCULAR | Status: DC

## 2013-08-04 NOTE — Progress Notes (Signed)
  Subjective:    Patient ID: [name], female    DOB: Mar 11, 1951, 62 y.o.   MRN: [id number]  HPI  [name] is here today complaining of left hip pain that radiates down to her knee.  She says that this has been going on for several weeks. She does not recall any injury to her hip. She has used ice and a Psychologist, clinical  for her pain which have not helped very much.     Review of Systems  Constitutional: Negative.   HENT: Negative.   Eyes: Negative.   Respiratory: Negative.   Cardiovascular: Negative.   Gastrointestinal: Negative.   Endocrine: Negative.   Genitourinary: Negative.   Musculoskeletal:       Left Hip Pain  Skin: Negative.   Allergic/Immunologic: Negative.   Neurological: Negative.   Hematological: Negative.   Psychiatric/Behavioral: Negative.      Past Medical History  Diagnosis Date  . Rash   . Anxiety   . Edema   . Eczema   . ADHD (attention deficit hyperactivity disorder), inattentive type   . Psoriasis     Hands/ Feet      Family History  Problem Relation Age of Onset  . Hypertension Mother   . Depression Mother   . Depression Father   . Cancer Maternal Aunt     breast cancer      History   Social History Narrative   Marital Status: Single   Children:  None   Pets:  Dog (1)     Living Situation: Lives with parents.    Occupation: Lawyer)     Education: Theatre stage manager)     Tobacco Use/Exposure:  None    Alcohol Use:  3-4 times a week (Wine)    Drug Use:  None   Diet:  Regular   Exercise:  3-4 times a week (Walking/ 30 min exercise class)    Hobbies:  Travel/Cooking                 Objective:   Physical Exam  Nursing note and vitals reviewed. Constitutional: She appears well-developed and well-nourished.  HENT:  Head: Normocephalic.  Cardiovascular: Normal rate, regular rhythm and normal heart sounds.   Pulmonary/Chest: Effort normal and breath sounds normal.   Musculoskeletal: She exhibits tenderness (Pain in left hip). She exhibits no edema.  Neurological: She is alert.  Skin: No erythema.      Assessment & Plan:    [name] was seen today for hip pain.  Diagnoses and associated orders for this visit:  Bursitis of left hip - triamcinolone acetonide (KENALOG-40) injection 40 mg; Inject 1 mL (40 mg total) into the articular space once. - traMADol (ULTRAM) 50 MG tablet; Take 1 tablet (50 mg total) by mouth every 12 (twelve) hours as needed. - cyclobenzaprine (FLEXERIL) 5 MG tablet; Take 1-2 tab at night as directed  Procedure Note:  Left Bursa   Her left hip was cleaned with Betadine and alcohol.  1 cc of Kenalog (40 mg) was injected along with 1 cc of Lidocaine into her left bursa without difficulty.

## 2013-11-02 ENCOUNTER — Ambulatory Visit: Payer: BC Managed Care – PPO | Admitting: Family Medicine

## 2013-11-08 ENCOUNTER — Other Ambulatory Visit: Payer: Self-pay | Admitting: Family Medicine

## 2013-11-13 ENCOUNTER — Other Ambulatory Visit: Payer: Self-pay | Admitting: Family Medicine

## 2013-11-17 ENCOUNTER — Ambulatory Visit (INDEPENDENT_AMBULATORY_CARE_PROVIDER_SITE_OTHER): Payer: BC Managed Care – PPO | Admitting: Family Medicine

## 2013-11-17 ENCOUNTER — Encounter: Payer: Self-pay | Admitting: Family Medicine

## 2013-11-17 VITALS — BP 110/74 | HR 87 | Resp 16 | Ht 67.5 in | Wt 166.0 lb

## 2013-11-17 DIAGNOSIS — M7072 Other bursitis of hip, left hip: Secondary | ICD-10-CM

## 2013-11-17 DIAGNOSIS — R5381 Other malaise: Secondary | ICD-10-CM

## 2013-11-17 DIAGNOSIS — F411 Generalized anxiety disorder: Secondary | ICD-10-CM

## 2013-11-17 DIAGNOSIS — M76899 Other specified enthesopathies of unspecified lower limb, excluding foot: Secondary | ICD-10-CM

## 2013-11-17 DIAGNOSIS — R5383 Other fatigue: Secondary | ICD-10-CM

## 2013-11-17 DIAGNOSIS — E663 Overweight: Secondary | ICD-10-CM

## 2013-11-17 MED ORDER — CITALOPRAM HYDROBROMIDE 40 MG PO TABS: 40.0000 mg | ORAL_TABLET | Freq: Every day | ORAL | Status: DC

## 2013-11-17 MED ORDER — PHENDIMETRAZINE TARTRATE 35 MG PO TABS: 1.0000 | ORAL_TABLET | Freq: Three times a day (TID) | ORAL | Status: DC

## 2013-11-17 MED ORDER — CYCLOBENZAPRINE HCL 5 MG PO TABS: ORAL_TABLET | ORAL | Status: DC

## 2013-11-17 MED ORDER — CYANOCOBALAMIN 1000 MCG/ML IJ SOLN: 500.0000 ug | INTRAMUSCULAR | Status: AC

## 2013-11-17 NOTE — Progress Notes (Signed)
Subjective:    Patient ID: [name], female    DOB: 13-Sep-1951, 63 y.o.   MRN: [id number]  HPI  Wendelin is here today to discuss a few issues:   1)  Mood - Her mood is okay on Celexa.  She feels that she needs to increase her dosage a little.     2)  Weight - She wants to do another round of the HCG diet.  She has done it several times in the past and does well with it.     Review of Systems  Constitutional: Positive for appetite change and unexpected weight change. Negative for fatigue.  Cardiovascular: Negative for chest pain and palpitations.  Neurological: Negative for dizziness and headaches.  Psychiatric/Behavioral: Positive for decreased concentration. Negative for behavioral problems, sleep disturbance, dysphoric mood and agitation. The patient is not nervous/anxious and is not hyperactive.   All other systems reviewed and are negative.    Past Medical History  Diagnosis Date  . Rash   . Anxiety   . Edema   . Eczema   . ADHD (attention deficit hyperactivity disorder), inattentive type   . Psoriasis     Hands/ Feet     Past Surgical History  Procedure Laterality Date  . Incision and drainage abscess / hematoma of bursa / knee / thigh       History   Social History Narrative   Marital Status: Single   Children:  None   Pets:  Dog (1)     Living Situation: Lives with parents.    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 Hypertension Mother   . Depression Mother   . Depression Father   . Cancer Maternal Aunt     breast cancer     Current Outpatient Prescriptions on File Prior to Visit  Medication Sig Dispense Refill  .  clobetasol ointment (TEMOVATE) 0.05 % Apply topically 2 (two) times daily.  60 g  11  . diclofenac sodium (VOLTAREN) 1 % GEL Apply 4 g topically 4 (four) times daily.  10 Tube  3  . furosemide (LASIX) 20 MG tablet Take 1 tablet (20 mg total) by mouth 2 (two) times daily.  90 tablet  1  . lisdexamfetamine (VYVANSE) 50 MG capsule Take 1 capsule (50 mg total) by mouth every morning.  30 capsule  0  . traMADol (ULTRAM) 50 MG tablet Take 1 tablet (50 mg total) by mouth every 12 (twelve) hours as needed.  60 tablet  2   No current facility-administered medications on file prior to visit.     Allergies  Allergen Reactions  . Codeine      Immunization History  Administered Date(s) Administered  . Tdap 05/25/2008       Objective:   Physical Exam  Constitutional: She appears well-nourished. No distress.  Cardiovascular: Normal rate, regular rhythm and normal heart sounds.   Pulmonary/Chest: Effort normal and breath sounds normal.  Neurological: She is alert.  Psychiatric: She has a normal mood and affect. Her  behavior is normal. Judgment and thought content normal.       Assessment & Plan:    Serina Cowperlisa was seen today for weight gain and medication refill.  Diagnoses and associated orders for this visit:  Anxiety state, unspecified Comments: She continues to be very stressed at home due to what all she has to do with her parents.  We'll see how she does with increasing her citalopram to 40 mg.   - citalopram (CELEXA) 40 MG tablet; Take 1 tablet (40 mg total) by mouth daily.  Overweight Comments: She will do another round of HCG diet.   - Phendimetrazine Tartrate 35 MG TABS; Take 1 tablet (35 mg total) by mouth 3 (three) times daily before meals.  Other malaise and fatigue - cyanocobalamin (,VITAMIN B-12,) 1000 MCG/ML injection; Inject 0.5 mLs (500 mcg total) into the muscle once a week. Mix with HCG  Bursitis of left hip - cyclobenzaprine (FLEXERIL) 5 MG tablet; Take 1-2 tab at  night as directed   TIME SPENT "FACE TO FACE" WITH PATIENT -  30 MINS

## 2013-11-20 ENCOUNTER — Other Ambulatory Visit: Payer: Self-pay | Admitting: Family Medicine

## 2013-12-24 DIAGNOSIS — R5383 Other fatigue: Secondary | ICD-10-CM

## 2013-12-24 DIAGNOSIS — E663 Overweight: Secondary | ICD-10-CM | POA: Insufficient documentation

## 2013-12-24 DIAGNOSIS — M7072 Other bursitis of hip, left hip: Secondary | ICD-10-CM | POA: Insufficient documentation

## 2013-12-24 DIAGNOSIS — R5381 Other malaise: Secondary | ICD-10-CM | POA: Insufficient documentation

## 2013-12-25 ENCOUNTER — Encounter (INDEPENDENT_AMBULATORY_CARE_PROVIDER_SITE_OTHER): Payer: BC Managed Care – PPO | Admitting: Family Medicine

## 2013-12-25 ENCOUNTER — Encounter (INDEPENDENT_AMBULATORY_CARE_PROVIDER_SITE_OTHER): Payer: Self-pay

## 2013-12-25 ENCOUNTER — Encounter: Payer: Self-pay | Admitting: Family Medicine

## 2013-12-25 ENCOUNTER — Ambulatory Visit (INDEPENDENT_AMBULATORY_CARE_PROVIDER_SITE_OTHER): Payer: BC Managed Care – PPO | Admitting: Family Medicine

## 2013-12-25 VITALS — BP 119/80 | HR 104 | Resp 16 | Ht 67.5 in | Wt 158.0 lb

## 2013-12-25 DIAGNOSIS — F411 Generalized anxiety disorder: Secondary | ICD-10-CM

## 2013-12-25 DIAGNOSIS — E663 Overweight: Secondary | ICD-10-CM

## 2013-12-25 MED ORDER — PHENTERMINE HCL 37.5 MG PO TABS: 37.5000 mg | ORAL_TABLET | Freq: Every day | ORAL | Status: DC

## 2013-12-25 MED ORDER — HYDROXYZINE PAMOATE 25 MG PO CAPS: ORAL_CAPSULE | ORAL | Status: AC

## 2013-12-25 NOTE — Patient Instructions (Signed)
1)  Weight - You have done well on Phase II of the HCG diet. You are ready to move on to Phase III where you are to will limit your intake of starch and sugar and calories to 1000.  Look into getting a device like a FitBit to keep track of your exercise and calories.

## 2013-12-25 NOTE — Progress Notes (Signed)
   Subjective:    Patient ID: [name], female    DOB: [birth year], 63 y.o.   MRN: [id number]  HPI  [name] is here today for a follow up of her weight loss. She has just completed her last week of the "Step By Step"  Program.  She is taking Phendimetrazine without any problem. She has also been receiving HCG injections without difficulty.  She has been following the 500 calorie diet and has lost 12 lbs since her last visit.  She thinks that she may want to continue for another 10 days of tra time on her HCG to to reach her goal of 146 lbs. Her mood is better on 40 mg of Celexa.     Review of Systems  Constitutional: Negative for activity change, appetite change, fatigue and unexpected weight change.       12 lb weight loss  Respiratory: Negative for shortness of breath.   Cardiovascular: Negative for chest pain and palpitations.  Gastrointestinal: Negative.   Genitourinary: Negative.   Neurological: Negative.   Psychiatric/Behavioral: Negative.        Objective:   Physical Exam  Vitals reviewed. Constitutional: She is oriented to person, place, and time.  HENT:  Head: Normocephalic and atraumatic.  Eyes: Conjunctivae are normal. No scleral icterus.  Neck: Normal range of motion. Neck supple. No thyromegaly present.  Cardiovascular: Normal rate and regular rhythm.   Pulmonary/Chest: Effort normal and breath sounds normal.  Musculoskeletal: She exhibits no edema.  Neurological: She is alert and oriented to person, place, and time.  Skin: Skin is warm and dry. No rash noted.  Psychiatric: She has a normal mood and affect. Her behavior is normal. Judgment and thought content normal.      Assessment & Plan:   [name] was seen today for medication refill and weight check.  Diagnoses and associated orders for this visit:  Anxiety state, unspecified Comments: Her symptoms are improved on Celexa 40 mg. She was given a refill for Vistaril.   - hydrOXYzine (VISTARIL) 25 MG capsule;  Take 1 capsule po up to twice a day for increased anxiety  Overweight - phentermine (ADIPEX-P) 37.5 MG tablet; Take 1 tablet (37.5 mg total) by mouth daily before breakfast.

## 2014-03-08 ENCOUNTER — Encounter: Payer: Self-pay | Admitting: Family Medicine

## 2014-03-08 ENCOUNTER — Ambulatory Visit (INDEPENDENT_AMBULATORY_CARE_PROVIDER_SITE_OTHER): Payer: BC Managed Care – PPO | Admitting: Family Medicine

## 2014-03-08 VITALS — BP 117/72 | HR 91 | Resp 16 | Ht 68.0 in | Wt 164.0 lb

## 2014-03-08 DIAGNOSIS — F411 Generalized anxiety disorder: Secondary | ICD-10-CM

## 2014-03-08 DIAGNOSIS — F988 Other specified behavioral and emotional disorders with onset usually occurring in childhood and adolescence: Secondary | ICD-10-CM

## 2014-03-08 DIAGNOSIS — R609 Edema, unspecified: Secondary | ICD-10-CM

## 2014-03-08 MED ORDER — FUROSEMIDE 20 MG PO TABS: 20.0000 mg | ORAL_TABLET | Freq: Two times a day (BID) | ORAL | Status: DC

## 2014-03-08 MED ORDER — CITALOPRAM HYDROBROMIDE 40 MG PO TABS: 40.0000 mg | ORAL_TABLET | Freq: Every day | ORAL | Status: DC

## 2014-03-08 MED ORDER — LISDEXAMFETAMINE DIMESYLATE 50 MG PO CAPS: 50.0000 mg | ORAL_CAPSULE | ORAL | Status: DC

## 2014-03-08 NOTE — Progress Notes (Signed)
Subjective:    Patient ID: Bonnie BoundAlisa Kennedy, female    DOB: 29-Sep-1950, 63 y.o.   MRN: [id number]  HPI  [name] is here today needing to get medication refills and to discuss the conditions listed below:    1)  ADD - She needs to have her Vyvanse 50 mg refilled.  She feels that her current dosage is appropriate.   2)  Mood - Her mood is stable on Celexa 40 mg.    3)  Edema - She needs to have her Lasix refilled.     Review of Systems  Constitutional: Negative for activity change, appetite change and fatigue.  Cardiovascular: Negative for chest pain, palpitations and leg swelling.  Psychiatric/Behavioral: Negative for behavioral problems, sleep disturbance and decreased concentration. The patient is not nervous/anxious.   All other systems reviewed and are negative.    Past Medical History  Diagnosis Date  . Rash   . Anxiety   . Edema   . Eczema   . ADHD (attention deficit hyperactivity disorder), inattentive type   . Psoriasis     Hands/ Feet     Past Surgical History  Procedure Laterality Date  . Incision and drainage abscess / hematoma of bursa / knee / thigh       History   Social History Narrative   Marital Status: Single   Children:  None   Pets:  Dog (1)     Living Situation: Lives with parents.    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 Hypertension Mother   . Depression Mother   . Depression Father   . Cancer Maternal Aunt     breast cancer     Current Outpatient Prescriptions on File Prior to Visit  Medication Sig Dispense Refill  . betamethasone dipropionate (DIPROLENE) 0.05 % ointment       . cyanocobalamin (,VITAMIN  B-12,) 1000 MCG/ML injection Inject 0.5 mLs (500 mcg total) into the muscle once a week. Mix with HCG  30 mL  0  . diclofenac sodium (VOLTAREN) 1 % GEL Apply 4 g topically 4 (four) times daily.  10 Tube  3  . hydrOXYzine (VISTARIL) 25 MG capsule Take 1 capsule po up to twice a day for increased anxiety  30 capsule  2   No current facility-administered medications on file prior to visit.     Allergies  Allergen Reactions  . Codeine      Immunization History  Administered Date(s) Administered  . Tdap 05/25/2008       Objective:   Physical Exam  Vitals reviewed. Constitutional: She appears well-nourished. No distress.  Cardiovascular: Normal rate, regular rhythm and normal heart sounds.   Pulmonary/Chest: Effort normal and breath sounds normal.  Neurological: She is alert.  Psychiatric: She has a normal mood and affect. Her behavior is normal. Judgment and thought content normal.      Assessment & Plan:    [name] was seen today for medication management.  Diagnoses and associated orders for this visit:  Attention deficit  disorder without mention of hyperactivity --  lisdexamfetamine (VYVANSE) 50 MG capsule; Take 1 capsule (50 mg total) by mouth every morning. Refilled x 3 months.    Anxiety state, unspecified - citalopram (CELEXA) 40 MG tablet; Take 1 tablet (40 mg total) by mouth daily.  Edema -  furosemide (LASIX) 20 MG tablet; Take 1 tablet (20 mg total) by mouth 2 (two) times daily.   TIME SPENT "FACE TO FACE" WITH PATIENT -  30 MINS

## 2014-03-21 ENCOUNTER — Ambulatory Visit (INDEPENDENT_AMBULATORY_CARE_PROVIDER_SITE_OTHER): Payer: BC Managed Care – PPO | Admitting: Emergency Medicine

## 2014-03-21 VITALS — BP 130/84 | HR 102 | Temp 98.4°F | Resp 16 | Ht 67.5 in | Wt 165.0 lb

## 2014-03-21 DIAGNOSIS — L02411 Cutaneous abscess of right axilla: Secondary | ICD-10-CM

## 2014-03-21 DIAGNOSIS — IMO0002 Reserved for concepts with insufficient information to code with codable children: Secondary | ICD-10-CM

## 2014-03-21 MED ORDER — DOXYCYCLINE HYCLATE 100 MG PO CAPS: 100.0000 mg | ORAL_CAPSULE | Freq: Two times a day (BID) | ORAL | Status: AC

## 2014-03-21 NOTE — Progress Notes (Signed)
   Subjective:    Patient ID: Bonnie BoundAlisa Kennedy, female    DOB: 1951/09/12, 63 y.o.   MRN: [id number]  HPI 63 year old female presents to [facility] with cyst right axilla x 2 days; woke up this morning and it was worse. Painful, red, hard knot  One other incident in 2012, cyst opened, grew staph on culture  Review of Systems     Objective:   Physical Exam There is a 1 x 2 cm cystic area with indentation of the skin in the right axilla. This area is tender to touch and there is redness around this area. The upper outer portion of the breast did not reveal any masses.       Assessment & Plan:  Return Saturday/Sunday if no improvement on antibiotic Doxycycline Heat, warm compresses

## 2014-03-30 ENCOUNTER — Other Ambulatory Visit: Payer: Self-pay | Admitting: Family Medicine

## 2014-04-03 ENCOUNTER — Other Ambulatory Visit: Payer: Self-pay | Admitting: Family Medicine

## 2014-04-03 DIAGNOSIS — M25552 Pain in left hip: Secondary | ICD-10-CM

## 2014-04-03 DIAGNOSIS — M7072 Other bursitis of hip, left hip: Secondary | ICD-10-CM

## 2014-04-03 MED ORDER — CYCLOBENZAPRINE HCL 5 MG PO TABS: ORAL_TABLET | ORAL | Status: AC

## 2014-04-03 MED ORDER — TRAMADOL HCL 50 MG PO TABS: 50.0000 mg | ORAL_TABLET | Freq: Two times a day (BID) | ORAL | Status: AC | PRN

## 2014-04-03 NOTE — Telephone Encounter (Signed)
[facility]

## 2014-04-27 ENCOUNTER — Ambulatory Visit (INDEPENDENT_AMBULATORY_CARE_PROVIDER_SITE_OTHER): Payer: BC Managed Care – PPO | Admitting: Family Medicine

## 2014-04-27 ENCOUNTER — Encounter: Payer: Self-pay | Admitting: Family Medicine

## 2014-04-27 VITALS — BP 149/85 | HR 94 | Resp 16 | Ht 68.0 in | Wt 164.0 lb

## 2014-04-27 DIAGNOSIS — F988 Other specified behavioral and emotional disorders with onset usually occurring in childhood and adolescence: Secondary | ICD-10-CM

## 2014-04-27 DIAGNOSIS — R609 Edema, unspecified: Secondary | ICD-10-CM

## 2014-04-27 DIAGNOSIS — N83209 Unspecified ovarian cyst, unspecified side: Secondary | ICD-10-CM

## 2014-04-27 DIAGNOSIS — F411 Generalized anxiety disorder: Secondary | ICD-10-CM

## 2014-04-27 MED ORDER — LISDEXAMFETAMINE DIMESYLATE 50 MG PO CAPS: 50.0000 mg | ORAL_CAPSULE | ORAL | Status: DC

## 2014-04-27 MED ORDER — LISDEXAMFETAMINE DIMESYLATE 50 MG PO CAPS: 50.0000 mg | ORAL_CAPSULE | ORAL | Status: AC

## 2014-04-27 MED ORDER — FUROSEMIDE 20 MG PO TABS: 20.0000 mg | ORAL_TABLET | Freq: Two times a day (BID) | ORAL | Status: AC

## 2014-04-27 MED ORDER — CITALOPRAM HYDROBROMIDE 40 MG PO TABS: 40.0000 mg | ORAL_TABLET | Freq: Every day | ORAL | Status: AC

## 2014-04-27 NOTE — Progress Notes (Signed)
Subjective:    Patient ID: [name], female    DOB: 1951/07/14, 63 y.o.   MRN: [id number]  HPI  [name] is here today to get some medication refills.  1)  ADD:  She is needing to get a refill on the Vyvnase. She is doing well on the current dose. She is eating and sleeping well.  2)  Anxiety:  She is doing well on the Celexa and would like to remain on it.    3)  Edema:  Her swelling is controlled on Lasix.   4)  Ovarian Cyst:  She had a CT scan of her abdomen & pelvis back in 2012 which showed a cyst.  [name] did not realize this until she recently got a copy of her records.  A follow up was recommended.  She would like to be referred for one.     Review of Systems  Constitutional: Negative for activity change and appetite change.  Cardiovascular: Negative for chest pain, palpitations and leg swelling.  Psychiatric/Behavioral: Negative for behavioral problems and decreased concentration. The patient is not nervous/anxious.   All other systems reviewed and are negative.    Past Medical History  Diagnosis Date  . Rash   . Anxiety   . Edema   . Eczema   . ADHD (attention deficit hyperactivity disorder), inattentive type   . Psoriasis     Hands/ Feet     Past Surgical History  Procedure Laterality Date  . Incision and drainage abscess / hematoma of bursa / knee / thigh       History   Social History Narrative   Marital Status: Single   Children:  None   Pets:  Dog (1)     Living Situation: Lives with parents.    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 Hypertension Mother   . Depression Mother   . Depression Father   . Cancer Maternal  Aunt     breast cancer     Current Outpatient Prescriptions on File Prior to Visit  Medication Sig Dispense Refill  . betamethasone dipropionate (DIPROLENE) 0.05 % ointment       . cyanocobalamin (,VITAMIN B-12,) 1000 MCG/ML injection Inject 0.5 mLs (500 mcg total) into the muscle once a week. Mix with HCG  30 mL  0  . cyclobenzaprine (FLEXERIL) 5 MG tablet Take 1-2 tab at night as directed  60 tablet  5  . diclofenac sodium (VOLTAREN) 1 % GEL Apply 4 g topically 4 (four) times daily.  10 Tube  3  . doxycycline (VIBRAMYCIN) 100 MG capsule Take 1 capsule (100 mg total) by mouth 2 (two) times daily.  20 capsule  0  . traMADol (ULTRAM) 50 MG tablet Take 1 tablet (50 mg total) by mouth every 12 (twelve) hours as needed.  60 tablet  5  . hydrOXYzine (VISTARIL) 25 MG capsule Take 1 capsule po up to twice a day for increased anxiety  30 capsule  2   No current facility-administered medications on  file prior to visit.     Allergies  Allergen Reactions  . Codeine      Immunization History  Administered Date(s) Administered  . Tdap 05/25/2008       Objective:   Physical Exam  Vitals reviewed. Constitutional: She appears well-nourished. No distress.  Cardiovascular: Normal rate, regular rhythm and normal heart sounds.   Pulmonary/Chest: Effort normal and breath sounds normal.  Neurological: She is alert.  Psychiatric: She has a normal mood and affect. Her behavior is normal. Judgment and thought content normal.      Assessment & Plan:    [name] was seen today for medication management.  Diagnoses and associated orders for this visit:  Attention deficit disorder without mention of hyperactivity - lisdexamfetamine (VYVANSE) 50 MG capsule; Take 1 capsule (50 mg total) by mouth every morning.  Anxiety state, unspecified - citalopram (CELEXA) 40 MG tablet; Take 1 tablet (40 mg total) by mouth daily.  Edema - furosemide (LASIX) 20 MG tablet; Take 1 tablet (20 mg total) by mouth 2  (two) times daily.  Other and unspecified ovarian cyst - US Pelvis Complete; Future   2.2 cm simple right ovarian cyst which has benign characteristics. In a postmenopausal female, yearly followup by ultrasound is recommended to confirm stability. This recommendation follows the consensus statement: Management of Asymptomatic Ovarian and Other Adnexal Cysts Imaged at US: Society of Radiologists in Ultrasound Consensus Conference Statement. Radiology 2010; [id number]. Normal postmenopausal appearance of uterus and left ovary.  She was given these results and reminded that she will need an annual U/S to follow up on stability.     TIME SPENT "FACE TO FACE" WITH PATIENT -  30 MINS

## 2014-05-03 ENCOUNTER — Ambulatory Visit (HOSPITAL_COMMUNITY)
Admission: RE | Admit: 2014-05-03 | Discharge: 2014-05-03 | Disposition: A | Discharge status: Home / Self Care | Payer: BC Managed Care – PPO | Source: Ambulatory Visit | Attending: Family Medicine | Admitting: Family Medicine

## 2014-05-03 ENCOUNTER — Other Ambulatory Visit: Payer: Self-pay | Admitting: Family Medicine

## 2014-05-03 DIAGNOSIS — Z780 Asymptomatic menopausal state: Secondary | ICD-10-CM | POA: Diagnosis not present

## 2014-05-03 DIAGNOSIS — N83209 Unspecified ovarian cyst, unspecified side: Secondary | ICD-10-CM | POA: Diagnosis present

## 2015-05-06 IMAGING — US US TRANSVAGINAL NON-OB
1 series · 13 of 25 positions shown · non-contrast
Comparison: CT on 11/23/2010

CLINICAL DATA: Followup right ovarian cystic lesion seen on prior
CT. Postmenopausal female.

EXAM:
TRANSABDOMINAL AND TRANSVAGINAL ULTRASOUND OF PELVIS
TECHNIQUE: Both transabdominal and transvaginal ultrasound examinations of the
pelvis were performed. Transabdominal technique was performed for
global imaging of the pelvis including uterus, ovaries, adnexal
regions, and pelvic cul-de-sac. It was necessary to proceed with
endovaginal exam following the transabdominal exam to visualize the
endometrium and ovaries.

[Series 1: us pelvis complete · 13 of 36 slices shown]
[im 1/36]
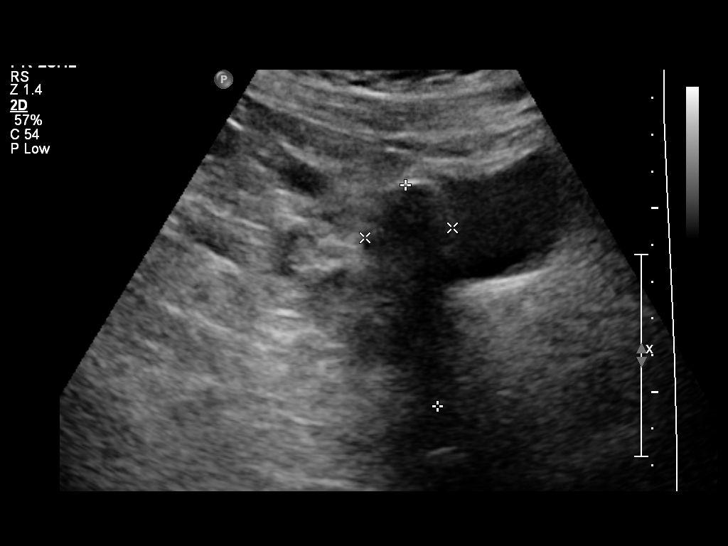
[im 3/36]
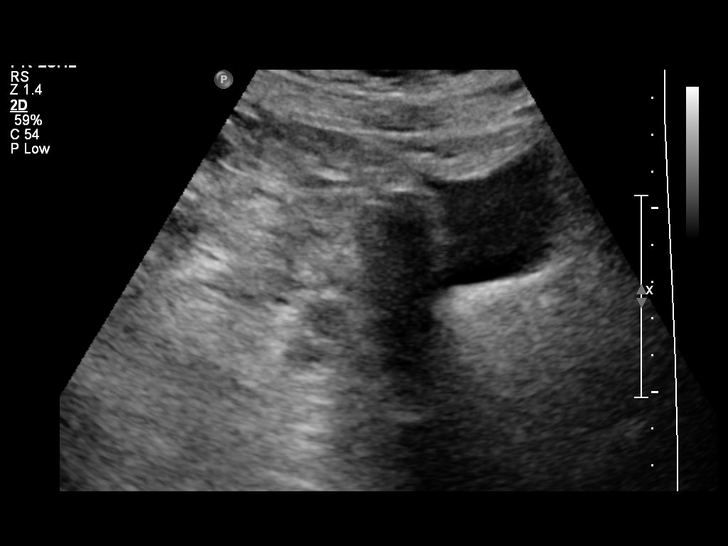
[im 6/36]
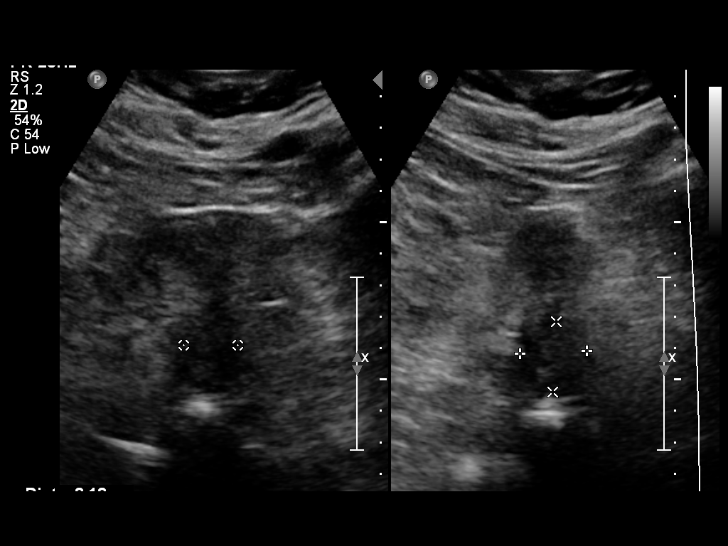
[im 9/36]
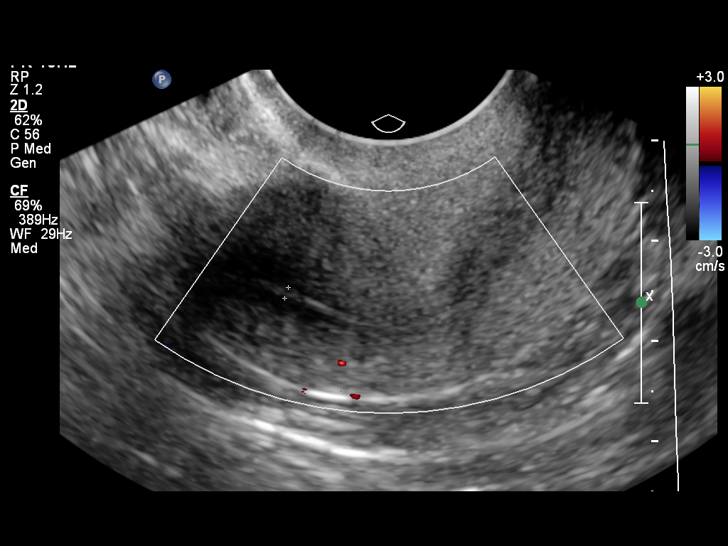
[im 12/36]
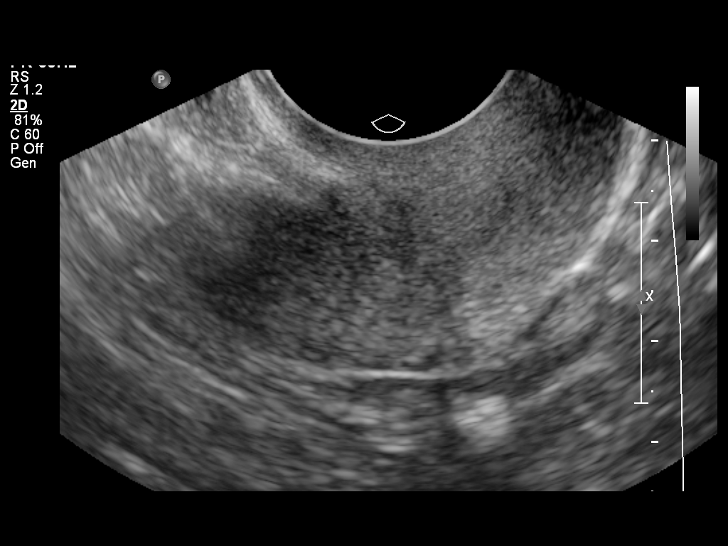
[im 15/36]
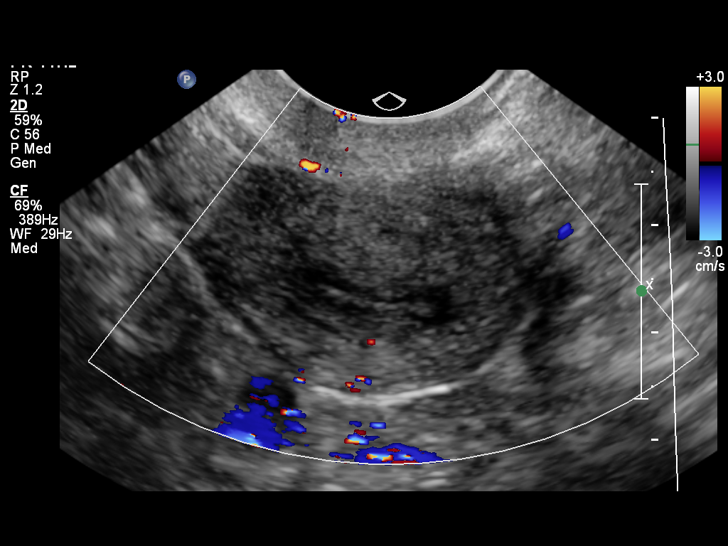
[im 18/36]
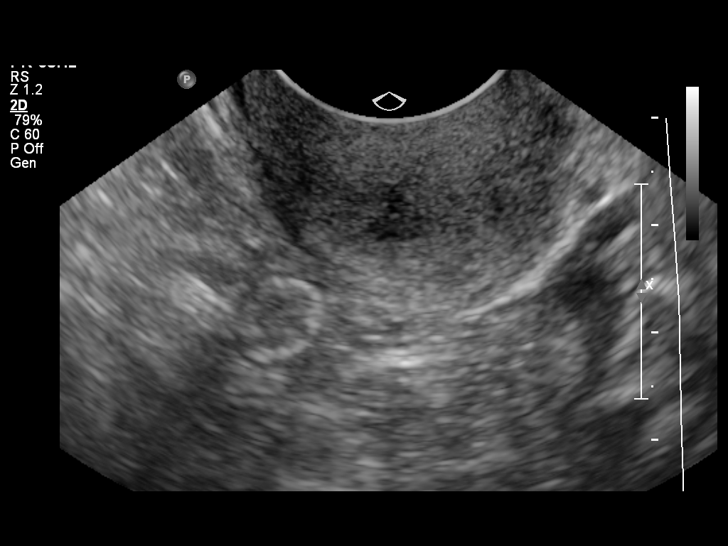
[im 21/36]
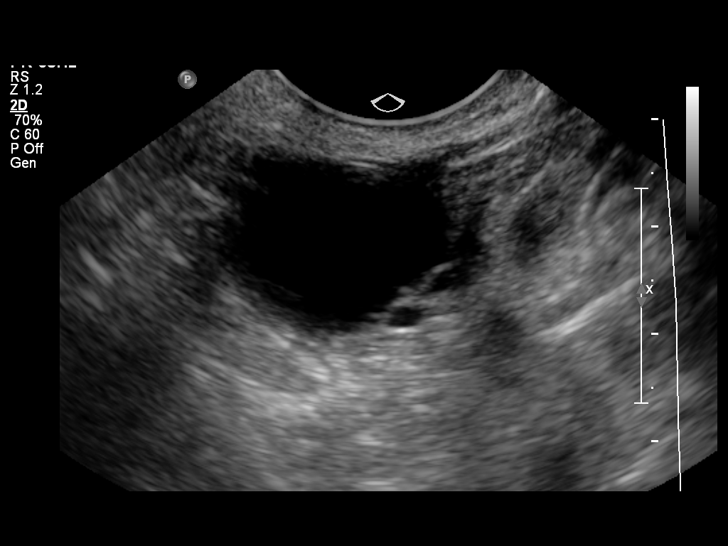
[im 24/36]
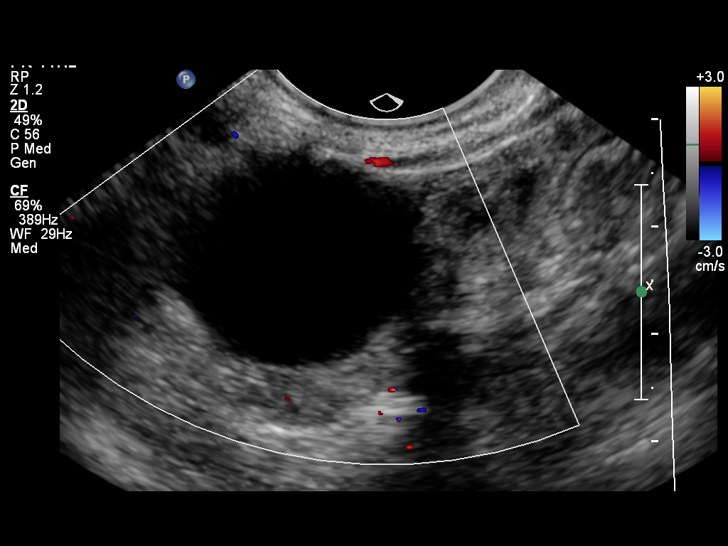
[im 27/36]
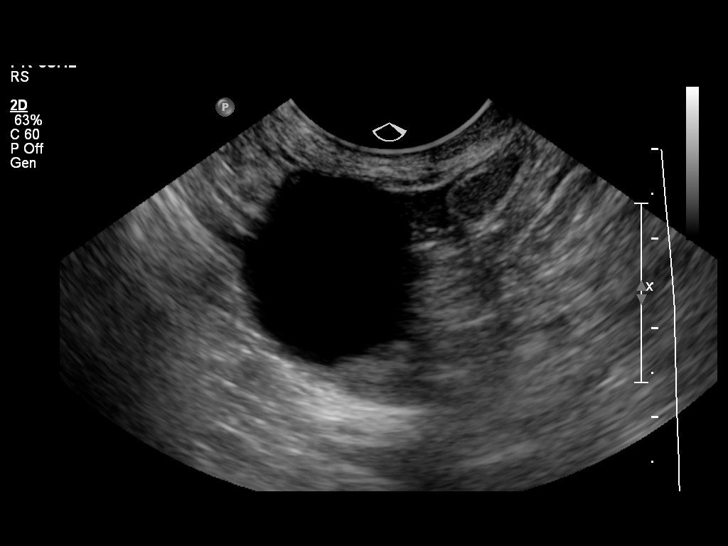
[im 30/36]
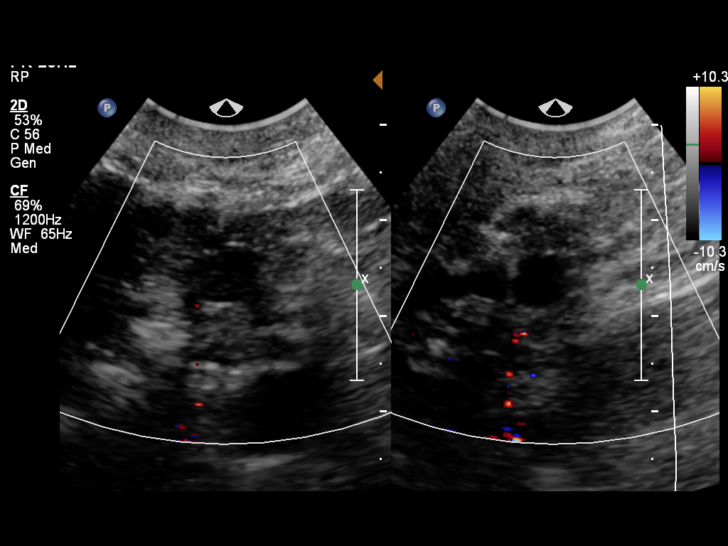
[im 33/36]
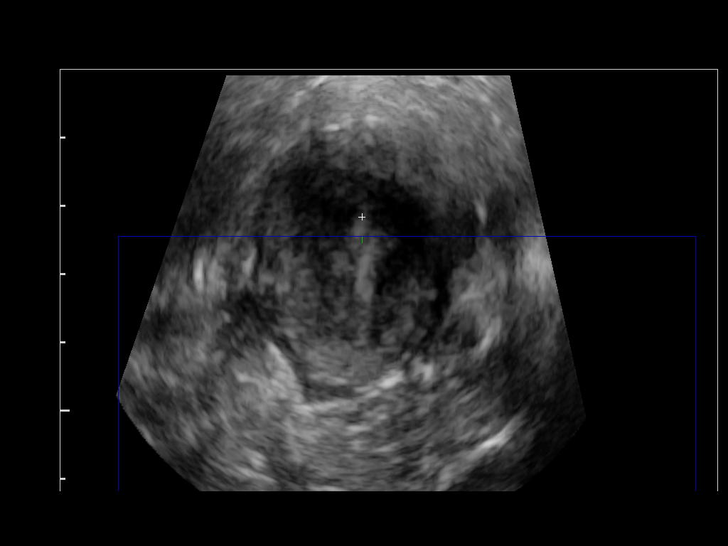
[im 36/36]
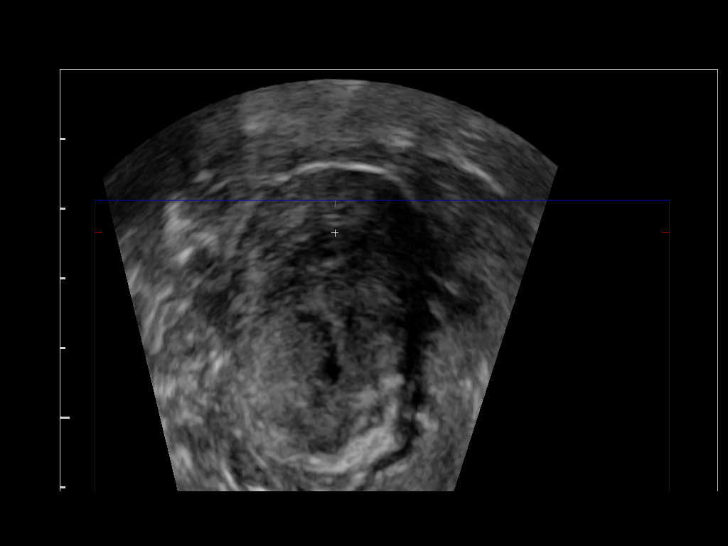

[13 of 25 positions shown; findings below may reference images not displayed]

FINDINGS: Uterus

Measurements: 5.0 x 2.5 x 2.8 cm. No fibroids or other mass
visualized.

Endometrium

Thickness: 1 mm.  No focal abnormality visualized.

Right ovary

Measurements: 2.2 x 2.3 x 2.5 cm. A simple appearing cyst is seen
which measures 2.1 x 2.0 x 2.2 cm this is not simply changed in size
compared to prior CT.

Left ovary

Measurements: 0.8 x 1.4 x 0.8 cm. Normal appearance/no adnexal mass.

Other findings

No free fluid.
IMPRESSION: 2.2 cm simple right ovarian cyst which has benign characteristics.
In a postmenopausal female, yearly followup by ultrasound is
recommended to confirm stability. This recommendation follows the
consensus statement: Management of Asymptomatic Ovarian and Other
Adnexal Cysts Imaged at US: Society of Radiologists in Ultrasound

Normal postmenopausal appearance of uterus and left ovary.

## 2015-11-07 ENCOUNTER — Ambulatory Visit: Payer: Self-pay | Admitting: Podiatry

## 2015-11-25 ENCOUNTER — Ambulatory Visit (INDEPENDENT_AMBULATORY_CARE_PROVIDER_SITE_OTHER): Payer: BLUE CROSS/BLUE SHIELD | Admitting: Podiatry

## 2015-11-25 ENCOUNTER — Other Ambulatory Visit: Payer: Self-pay

## 2015-11-25 ENCOUNTER — Encounter: Payer: Self-pay | Admitting: Podiatry

## 2015-11-25 DIAGNOSIS — M79605 Pain in left leg: Secondary | ICD-10-CM

## 2015-11-25 DIAGNOSIS — M205X9 Other deformities of toe(s) (acquired), unspecified foot: Secondary | ICD-10-CM

## 2015-11-25 DIAGNOSIS — M205X2 Other deformities of toe(s) (acquired), left foot: Secondary | ICD-10-CM | POA: Insufficient documentation

## 2015-11-25 DIAGNOSIS — M79673 Pain in unspecified foot: Secondary | ICD-10-CM

## 2015-11-25 DIAGNOSIS — M21969 Unspecified acquired deformity of unspecified lower leg: Secondary | ICD-10-CM | POA: Diagnosis not present

## 2015-11-25 DIAGNOSIS — M792 Neuralgia and neuritis, unspecified: Secondary | ICD-10-CM

## 2015-11-25 MED ORDER — TRIAMCINOLONE ACETONIDE 10 MG/ML IJ SUSP: 10.0000 mg | Freq: Once | INTRAMUSCULAR | Status: AC

## 2015-11-25 MED ORDER — LIDOCAINE 5 % EX PTCH: 1.0000 | MEDICATED_PATCH | CUTANEOUS | Status: AC

## 2015-11-25 NOTE — Patient Instructions (Signed)
Seen for left big joint pain. X-ray show loss of cartilage and severe elevation. Cortisone injection given. May benefit form decompression osteotomy first Met left.

## 2015-11-25 NOTE — Progress Notes (Signed)
SUBJECTIVE: 65 y.o. year old female presents pain in left foot 4-6 month, for the last one month pain is every day.  and has psoriasis on bottom of both feet since 2005.  Left first MPJ pain with poking sharp pain in between the first web space.  REVIEW OF SYSTEMS: A comprehensive review of systems was negative except for: Psoriasis and Sciatica.   OBJECTIVE: DERMATOLOGIC EXAMINATION: Nails: normal appearing nails bilaterally Skin Integrity: Some fissured and several small patches of dry, pink erupted skin plantar bilateral from psoriasis.  Thick broad callus under 2nd and 3rd MPJ bilateral.   VASCULAR EXAMINATION OF LOWER LIMBS: Pedal pulses: All pedal pulses are palpable with normal pulsation.   NEUROLOGIC EXAMINATION OF THE LOWER LIMBS: All epicritic and tactile sensations grossly intact.  MUSCULOSKELETAL EXAMINATION: Positive for enlarged bunion bilateral. Limited joint motion with pain first MPJ left foot.  Elevated first ray L>R bilateral.  All lesser digits are mildly contracted.   RADIOGRAPHIC STUDIES:  General overview: Negative of Osteopenia Absence of soft tissue swelling. Absence of acute osseous or articular surfaces of forefoot or rearfoot.  AP View:  Short first metatarsal with narrowed joint space first MPJ left and normal on right. Mild Hallux valgus with bunion bilateral.  Lateral view:  Elevated first ray bilateral noted.   ASSESSMENT: 1. Hallux limitus left. 2. Elevated first ray bilateral. 3. Neuritis, superficial branch of Deep Peroneal Nerve left.  4. Arthropathy first MPJ left.  PLAN: Reviewed clinical findings and available treatment options. First MPJ and the first Intermetatarsal space left foot injected with mixture of 4 mg Dexamethasone, 4 mg Triamcinolone, and 1 cc of 0.5% Marcaine plain. Patient tolerated well without difficulty.  Surgical option, decompression osteotomy of first ray left also reviewed.   Patient called after hour with  complaining of burning sensation at injection site. Lidoderm patch ordered.

## 2015-11-29 ENCOUNTER — Telehealth: Payer: Self-pay | Admitting: *Deleted

## 2015-11-29 MED ORDER — OXYCODONE-ACETAMINOPHEN 7.5-325 MG PO TABS: 1.0000 | ORAL_TABLET | Freq: Four times a day (QID) | ORAL | Status: AC | PRN

## 2015-11-29 NOTE — Telephone Encounter (Signed)
Pt called and left message stating pain patch is not helping with foot pain, requested pain med.

## 2015-12-02 ENCOUNTER — Ambulatory Visit: Payer: BLUE CROSS/BLUE SHIELD | Admitting: Podiatry
